# Patient Record
Sex: Male | Born: 1995 | Race: White | Hispanic: No | Marital: Single | Smoking: Never smoker
Health system: Southern US, Community
[De-identification: ages and names within clinical notes are randomized; demographics above are authoritative.]

## PROBLEM LIST (undated history)

## (undated) DIAGNOSIS — J45909 Unspecified asthma, uncomplicated: Secondary | ICD-10-CM

---
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
## 2018-01-15 ENCOUNTER — Emergency Department
Admission: EM | Admit: 2018-01-15 | Discharge: 2018-01-15 | Disposition: A | Discharge status: Home / Self Care | Payer: Self-pay | Attending: Emergency Medicine | Admitting: Emergency Medicine

## 2018-01-15 ENCOUNTER — Other Ambulatory Visit: Payer: Self-pay

## 2018-01-15 ENCOUNTER — Encounter: Payer: Self-pay | Admitting: Emergency Medicine

## 2018-01-15 DIAGNOSIS — R45851 Suicidal ideations: Secondary | ICD-10-CM | POA: Insufficient documentation

## 2018-01-15 DIAGNOSIS — F1510 Other stimulant abuse, uncomplicated: Secondary | ICD-10-CM

## 2018-01-15 DIAGNOSIS — F1410 Cocaine abuse, uncomplicated: Secondary | ICD-10-CM

## 2018-01-15 DIAGNOSIS — F1110 Opioid abuse, uncomplicated: Secondary | ICD-10-CM

## 2018-01-15 DIAGNOSIS — F1310 Sedative, hypnotic or anxiolytic abuse, uncomplicated: Secondary | ICD-10-CM

## 2018-01-15 DIAGNOSIS — F1994 Other psychoactive substance use, unspecified with psychoactive substance-induced mood disorder: Secondary | ICD-10-CM | POA: Insufficient documentation

## 2018-01-15 DIAGNOSIS — F1210 Cannabis abuse, uncomplicated: Secondary | ICD-10-CM

## 2018-01-15 DIAGNOSIS — F1910 Other psychoactive substance abuse, uncomplicated: Secondary | ICD-10-CM

## 2018-01-15 LAB — ETHANOL

## 2018-01-15 LAB — CBC
HEMATOCRIT: 42.2 % (ref 39.0–52.0)
Hemoglobin: 14 g/dL (ref 13.0–17.0)
MCH: 29.5 pg (ref 26.0–34.0)
MCHC: 33.2 g/dL (ref 30.0–36.0)
MCV: 89 fL (ref 80.0–100.0)
NRBC: 0 % (ref 0.0–0.2)
PLATELETS: 221 10*3/uL (ref 150–400)
RBC: 4.74 MIL/uL (ref 4.22–5.81)
RDW: 12.5 % (ref 11.5–15.5)
WBC: 7.8 10*3/uL (ref 4.0–10.5)

## 2018-01-15 LAB — SALICYLATE LEVEL

## 2018-01-15 LAB — COMPREHENSIVE METABOLIC PANEL
ALT: 10 U/L (ref 0–44)
AST: 15 U/L (ref 15–41)
Albumin: 4.4 g/dL (ref 3.5–5.0)
Alkaline Phosphatase: 63 U/L (ref 38–126)
Anion gap: 7 (ref 5–15)
BILIRUBIN TOTAL: 0.5 mg/dL (ref 0.3–1.2)
BUN: 20 mg/dL (ref 6–20)
CO2: 29 mmol/L (ref 22–32)
Calcium: 9.1 mg/dL (ref 8.9–10.3)
Chloride: 104 mmol/L (ref 98–111)
Creatinine, Ser: 1.03 mg/dL (ref 0.61–1.24)
Glucose, Bld: 102 mg/dL — ABNORMAL HIGH (ref 70–99)
POTASSIUM: 3.9 mmol/L (ref 3.5–5.1)
Sodium: 140 mmol/L (ref 135–145)
Total Protein: 7 g/dL (ref 6.5–8.1)

## 2018-01-15 LAB — URINE DRUG SCREEN, QUALITATIVE (ARMC ONLY)
AMPHETAMINES, UR SCREEN: POSITIVE — AB
BARBITURATES, UR SCREEN: NOT DETECTED
BENZODIAZEPINE, UR SCRN: POSITIVE — AB
Cannabinoid 50 Ng, Ur ~~LOC~~: POSITIVE — AB
Cocaine Metabolite,Ur ~~LOC~~: POSITIVE — AB
MDMA (Ecstasy)Ur Screen: NOT DETECTED
Methadone Scn, Ur: NOT DETECTED
OPIATE, UR SCREEN: POSITIVE — AB
PHENCYCLIDINE (PCP) UR S: NOT DETECTED
Tricyclic, Ur Screen: NOT DETECTED

## 2018-01-15 LAB — ACETAMINOPHEN LEVEL: Acetaminophen (Tylenol), Serum: <10 ug/mL — ABNORMAL LOW (ref 10–30)

## 2018-01-15 MED ORDER — SODIUM CHLORIDE 0.9 % IV BOLUS (SEPSIS): 1000.0000 mL | Freq: Once | INTRAVENOUS | Status: DC

## 2018-01-15 MED ORDER — SODIUM CHLORIDE 0.9 % IV BOLUS
1000.0000 mL | Freq: Once | INTRAVENOUS | Status: AC
  Administered 2018-01-15: 1000 mL via INTRAVENOUS

## 2018-01-15 NOTE — ED Notes (Addendum)
With male officer in attendance, pt removes jeans, plaid long sleeve shirt, grey long sleeve shirt, black wallet, cell phone, black hat, brown boots, red socks, red underwear, brown belt--all placed in labeled pt belonging bag to be secured on nursing unit & pt dressed in burgandy paper scrubs

## 2018-01-15 NOTE — ED Notes (Signed)
ED  Is the patient under IVC or is there intent for IVC: Yes.   Is the patient medically cleared: Yes.   Is there vacancy in the ED BHU: Yes.   Is the population mix appropriate for patient: Yes.   Is the patient awaiting placement in inpatient or outpatient setting:  Has the patient had a psychiatric consult:  Consult pending  Survey of unit performed for contraband, proper placement and condition of furniture, tampering with fixtures in bathroom, shower, and each patient room: Yes.  ; Findings:  APPEARANCE/BEHAVIOR Calm and cooperative NEURO ASSESSMENT Orientation: oriented x3  Denies pain Hallucinations: No.None noted (Hallucinations) denies  Speech: Normal Gait: normal RESPIRATORY ASSESSMENT Even  Unlabored respirations  CARDIOVASCULAR ASSESSMENT Pulses equal   regular rate  Skin warm and dry   GASTROINTESTINAL ASSESSMENT no GI complaint EXTREMITIES Full ROM  PLAN OF CARE Provide calm/safe environment. Vital signs assessed twice daily. ED BHU Assessment once each 12-hour shift. Collaborate with TTS daily or as condition indicates. Assure the ED provider has rounded once each shift. Provide and encourage hygiene. Provide redirection as needed. Assess for escalating behavior; address immediately and inform ED provider.  Assess family dynamic and appropriateness for visitation as needed: Yes.  ; If necessary, describe findings:  Educate the patient/family about BHU procedures/visitation: Yes.  ; If necessary, describe findings:   

## 2018-01-15 NOTE — ED Notes (Signed)
Patient observed lying in bed with eyes closed  Even, unlabored respirations observed   NAD pt appears to be sleeping  I will continue to monitor along with every 15 minute visual observations and ongoing security monitoring    

## 2018-01-15 NOTE — ED Notes (Signed)
Patient alert and oriented x 4. Patient denies SI/HI/AVH. Patient very guarded when this writer asking questions why he is here. Patient denies drug use except for 1 gram of xanax that was not prescribed to patient. Patient states he only smokes weed every once in a while. Patient states he does not know why he is here. This Clinical research associate explained that he is IVC and that he will need to see psych. Patient agreeable with plan.

## 2018-01-15 NOTE — ED Notes (Signed)
BEHAVIORAL HEALTH ROUNDING Patient sleeping: No. Patient alert and oriented: yes Behavior appropriate: Yes.  ; If no, describe:  Nutrition and fluids offered: yes Toileting and hygiene offered: Yes  Sitter present: q15 minute observations and security  monitoring Law enforcement present: Yes  ODS  
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
## 2018-01-15 NOTE — ED Notes (Signed)

## 2018-01-15 NOTE — ED Notes (Signed)
BEHAVIORAL HEALTH ROUNDING Patient sleeping: Yes.   Patient alert and oriented: eyes closed  Appears to be asleep Behavior appropriate: Yes.  ; If no, describe:  Nutrition and fluids offered: Yes  Toileting and hygiene offered: sleeping Sitter present: q 15 minute observations and security monitoring Law enforcement present: yes  ODS 

## 2018-01-15 NOTE — Discharge Instructions (Addendum)
Turn to the emergency department if you develop thoughts of hurting herself or anyone else, hallucinations, or any other symptoms concerning to you. °

## 2018-01-15 NOTE — ED Triage Notes (Addendum)
Patient ambulatory to triage with steady gait, without difficulty or distress noted, in custody of Product manager for [employer]; officer reports pt took xanax, unknown amount; pt st he took only "1/2 bar of a xanax"; pt denies SI or HI

## 2018-01-15 NOTE — ED Provider Notes (Signed)
[facility] Emergency Department Provider Note   First MD Initiated Contact with Patient 01/15/18 [phone]     (approximate)  I have reviewed the triage vital signs and the nursing notes.  Level 5 caveat: History and physical exam limited secondary to altered mental status HISTORY  Chief Complaint Mental Health Problem    HPI Jesus Morris is a 22 y.o. male presents to the emergency department via [facility] involuntarily committed secondary to stated suicidal ideation.  Patient admitted to the deputies that he took a "half of a Xanax".  Involuntary commitment papers stated that the patient's mother found him in his room minimally responsive.   Past medical history Unknown There are no active problems to display for this patient.   History reviewed. No pertinent surgical history.  Prior to Admission medications   Not on File    Allergies Peanut-containing drug products  No family history on file.  Social History Social History   Tobacco Use  . Smoking status: Never Smoker  . Smokeless tobacco: Never Used  Substance Use Topics  . Alcohol use: Not on file  . Drug use: Not on file    Review of Systems Constitutional: No fever/chills Eyes: No visual changes. ENT: No sore throat. Cardiovascular: Denies chest pain. Respiratory: Denies shortness of breath. Gastrointestinal: No abdominal pain.  No nausea, no vomiting.  No diarrhea.  No constipation. Genitourinary: Negative for dysuria. Musculoskeletal: Negative for neck pain.  Negative for back pain. Integumentary: Negative for rash. Neurological: Negative for headaches, focal weakness or numbness. Psychiatric:Positive for suicidal ideation and substance abuse   ____________________________________________   PHYSICAL EXAM:  VITAL SIGNS: ED Triage Vitals  Enc Vitals Group     BP 01/15/18 0418 119/73     Pulse Rate 01/15/18 0418 78     Resp 01/15/18 0418 18     Temp  01/15/18 0418 98 F (36.7 C)     Temp Source 01/15/18 0418 Oral     SpO2 01/15/18 0418 100 %     Weight 01/15/18 0419 70.3 kg (155 lb)     Height 01/15/18 0419 1.778 m (5\' 10" )     Head Circumference --      Peak Flow --      Pain Score 01/15/18 0418 0     Pain Loc --      Pain Edu? --      Excl. in GC? --     Constitutional: Alert and oriented.  Appears intoxicated  eyes: Conjunctivae are normal.  Dilated pupils bilaterally.  Reactive to light Head: Atraumatic. Mouth/Throat: Mucous membranes are moist.  Oropharynx non-erythematous. Neck: No stridor.   Cardiovascular: Normal rate, regular rhythm. Good peripheral circulation. Grossly normal heart sounds. Respiratory: Normal respiratory effort.  No retractions. Lungs CTAB. Gastrointestinal: Soft and nontender. No distention.  Musculoskeletal: No lower extremity tenderness nor edema. No gross deformities of extremities. Neurologic: Slurred speech and language. No gross focal neurologic deficits are appreciated.  Skin:  Skin is warm, dry and intact. No rash noted. Psychiatric: Patient appears intoxicated  ____________________________________________   LABS (all labs ordered are listed, but only abnormal results are displayed)  Labs Reviewed  COMPREHENSIVE METABOLIC PANEL - Abnormal; Notable for the following components:      Result Value   Glucose, Bld 102 (*)    All other components within normal limits  ACETAMINOPHEN LEVEL - Abnormal; Notable for the following components:   Acetaminophen (Tylenol), Serum <10 (*)    All other components  within normal limits  URINE DRUG SCREEN, QUALITATIVE ([facility] ONLY) - Abnormal; Notable for the following components:   Amphetamines, Ur Screen POSITIVE (*)    Cocaine Metabolite,Ur [place] POSITIVE (*)    Opiate, Ur Screen POSITIVE (*)    Cannabinoid 50 Ng, Ur [place] POSITIVE (*)    Benzodiazepine, Ur Scrn POSITIVE (*)    All other components within normal limits  ETHANOL  SALICYLATE LEVEL  CBC      Procedures   ____________________________________________   INITIAL IMPRESSION / ASSESSMENT AND PLAN / ED COURSE  As part of my medical decision making, I reviewed the following data within the electronic MEDICAL RECORD NUMBER 22 year old male presenting to the emergency department involuntarily committed secondary to above-stated suicidal ideation and substance abuse.  Patient urine drug screen positive for amphetamines benzodiazepines cocaine opiates and cannabinoids.  Await psychiatry consultation at this time ____________________________________________  FINAL CLINICAL IMPRESSION(S) / ED DIAGNOSES  Final diagnoses:  Polysubstance abuse (HCC)  Suicidal ideation     MEDICATIONS GIVEN DURING THIS VISIT:  Medications - No data to display   ED Discharge Orders    None       Note:  This document was prepared using Dragon voice recognition software and may include unintentional dictation errors.    Darci Current, MD 01/15/18 [phone]

## 2018-11-18 ENCOUNTER — Emergency Department
Admission: EM | Admit: 2018-11-18 | Discharge: 2018-11-18 | Disposition: A | Discharge status: Home / Self Care | Payer: Self-pay | Attending: Emergency Medicine | Admitting: Emergency Medicine

## 2018-11-18 ENCOUNTER — Other Ambulatory Visit: Payer: Self-pay

## 2018-11-18 ENCOUNTER — Emergency Department: Payer: Self-pay

## 2018-11-18 ENCOUNTER — Encounter: Payer: Self-pay | Admitting: Emergency Medicine

## 2018-11-18 DIAGNOSIS — J4541 Moderate persistent asthma with (acute) exacerbation: Secondary | ICD-10-CM | POA: Insufficient documentation

## 2018-11-18 DIAGNOSIS — J45901 Unspecified asthma with (acute) exacerbation: Secondary | ICD-10-CM

## 2018-11-18 DIAGNOSIS — Z91010 Allergy to peanuts: Secondary | ICD-10-CM | POA: Insufficient documentation

## 2018-11-18 HISTORY — DX: Unspecified asthma, uncomplicated: J45.909

## 2018-11-18 MED ORDER — PREDNISONE 10 MG PO TABS: 50.0000 mg | ORAL_TABLET | Freq: Every day | ORAL | 0 refills | Status: AC

## 2018-11-18 MED ORDER — PREDNISONE 20 MG PO TABS
60.0000 mg | ORAL_TABLET | Freq: Once | ORAL | Status: AC
  Administered 2018-11-18: 60 mg via ORAL
  Filled 2018-11-18: qty 3

## 2018-11-18 MED ORDER — IPRATROPIUM BROMIDE 0.02 % IN SOLN: 0.5000 mg | Freq: Four times a day (QID) | RESPIRATORY_TRACT | 0 refills | Status: AC | PRN

## 2018-11-18 MED ORDER — IPRATROPIUM-ALBUTEROL 20-100 MCG/ACT IN AERS: 1.0000 | INHALATION_SPRAY | Freq: Four times a day (QID) | RESPIRATORY_TRACT | 0 refills | Status: AC | PRN

## 2018-11-18 MED ORDER — IPRATROPIUM-ALBUTEROL 0.5-2.5 (3) MG/3ML IN SOLN
3.0000 mL | Freq: Once | RESPIRATORY_TRACT | Status: AC
Start: 2018-11-18 — End: 2018-11-18
  Administered 2018-11-18: 3 mL via RESPIRATORY_TRACT
  Filled 2018-11-18: qty 3

## 2018-11-18 MED ORDER — ALBUTEROL SULFATE HFA 108 (90 BASE) MCG/ACT IN AERS: 2.0000 | INHALATION_SPRAY | RESPIRATORY_TRACT | 0 refills | Status: DC | PRN

## 2018-11-18 MED ORDER — ALBUTEROL SULFATE HFA 108 (90 BASE) MCG/ACT IN AERS: 2.0000 | INHALATION_SPRAY | RESPIRATORY_TRACT | 0 refills | Status: AC | PRN

## 2018-11-18 NOTE — ED Provider Notes (Addendum)
[facility] Emergency Department Provider Note  ____________________________________________  Time seen: Approximately 12:23 PM  I have reviewed the triage vital signs and the nursing notes.   HISTORY  Chief Complaint Asthma   HPI Jesus Morris is a 23 y.o. male presents to the emergency department for treatment and evaluation of asthma.  Wheezing has been worse over the past 24 hours.  He has used his inhaler at home with some relief but continues to wheeze today and feels short of breath.   Mom states that he has had posttussive emesis since last night.   Past Medical History:  Diagnosis Date  . Asthma     Patient Active Problem List   Diagnosis Date Noted  . 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 Opiate abuse, continuous ([place]) 01/15/2018  . Cocaine abuse ([place]) 01/15/2018  . 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 Cannabis abuse 01/15/2018  . Substance induced mood disorder ([place]) 01/15/2018    History reviewed. No pertinent surgical history.  Prior to Admission medications   Medication Sig Start Date End Date Taking? Authorizing Provider  albuterol (VENTOLIN HFA) 108 (90 Base) MCG/ACT inhaler Inhale 2 puffs into the lungs every 4 (four) hours as needed for wheezing or shortness of breath. 11/18/18   Wyona Neils B, FNP  Ipratropium-Albuterol (COMBIVENT) 20-100 MCG/ACT AERS respimat Inhale 1 puff into the lungs every 6 (six) hours as needed for wheezing. 11/18/18   Aeon Kessner B, FNP  predniSONE (DELTASONE) 10 MG tablet Take 5 tablets (50 mg total) by mouth daily. 11/18/18   Elnor Renovato, Johnette Abraham B, FNP    Allergies Other and Peanut-containing drug products  No family history on file.  Social History Social History   Tobacco Use  . Smoking status: Never Smoker  . Smokeless tobacco: Never Used  Substance Use Topics  . Alcohol use: Not on file  . Drug use: Not on file    Review of Systems Constitutional: Negative for fever/chills.  Normal  appetite. ENT: Negative for sore throat. Cardiovascular: Denies chest pain. Respiratory: Positive for shortness of breath.  Positive for cough.  Positive for wheezing.  Gastrointestinal: Negative for nausea, positive for posttussive vomiting.  Negative for diarrhea.  Musculoskeletal: Negative for body aches Skin: Negative for rash. Neurological: Negative for headaches ____________________________________________   PHYSICAL EXAM:  VITAL SIGNS: ED Triage Vitals  Enc Vitals Group     BP 11/18/18 1142 119/82     Pulse Rate 11/18/18 1142 (!) 108     Resp --      Temp 11/18/18 1142 98.4 F (36.9 C)     Temp Source 11/18/18 1142 Oral     SpO2 11/18/18 1142 94 %     Weight 11/18/18 1143 165 lb (74.8 kg)     Height 11/18/18 1143 5\' 11"  (1.803 m)     Head Circumference --      Peak Flow --      Pain Score 11/18/18 1145 0     Pain Loc --      Pain Edu? --      Excl. in [place]? --     Constitutional: Alert and oriented.  Overall well appearing and in no acute distress. Eyes: Conjunctivae are normal. Ears: TM normal Nose: No sinus congestion noted; no rhinnorhea. Mouth/Throat: Mucous membranes are moist.   Neck: No stridor.  Cardiovascular: Normal rate, regular rhythm. Good peripheral circulation. Respiratory: Respirations are even and unlabored.  No retractions. Rhonchi and wheezing throughout. Gastrointestinal: Soft and nontender.  Musculoskeletal: FROM  x 4 extremities.  Neurologic:  Normal speech and language. Skin:  Skin is warm, dry and intact. No rash noted. Psychiatric: Mood and affect are normal. Speech and behavior are normal.  ____________________________________________   LABS (all labs ordered are listed, but only abnormal results are displayed)  Labs Reviewed - No data to display ____________________________________________  EKG  Not indicated. ____________________________________________  RADIOLOGY  Chest x-ray is  negative. ____________________________________________   PROCEDURES  Procedure(s) performed: None  Critical Care performed: No ____________________________________________   INITIAL IMPRESSION / ASSESSMENT AND PLAN / ED COURSE  23 y.o. male presenting to the emergency department for treatment and evaluation of asthma exacerbation.  Patient has no other signs of COVID-19.  While here, he was given a dose of prednisone and a DuoNeb.  Wheezing and rhonchi resolved.  Chest x-ray was normal.  He will be given prescriptions for prednisone, Combivent, and a refill of his albuterol inhaler.  He was encouraged to follow-up with primary care or return to the emergency department for symptoms of concern.   ----------------------------------------- 2:59 PM on 11/18/2018 -----------------------------------------  Mother called back. Inhaler is over $400.00 and they can not afford to get it. Solution was sent to [facility] per mother's request and she can also get a nebulizer machine there as well.   Medications  predniSONE (DELTASONE) tablet 60 mg (60 mg Oral Given 11/18/18 1233)  ipratropium-albuterol (DUONEB) 0.5-2.5 (3) MG/3ML nebulizer solution 3 mL (3 mLs Nebulization Given 11/18/18 1233)    ED Discharge Orders         Ordered    Ipratropium-Albuterol (COMBIVENT) 20-100 MCG/ACT AERS respimat  Every 6 hours PRN     11/18/18 1327    albuterol (VENTOLIN HFA) 108 (90 Base) MCG/ACT inhaler  Every 4 hours PRN,   Status:  Discontinued     11/18/18 1327    predniSONE (DELTASONE) 10 MG tablet  Daily     11/18/18 1340    albuterol (VENTOLIN HFA) 108 (90 Base) MCG/ACT inhaler  Every 4 hours PRN     11/18/18 1342           Pertinent labs & imaging results that were available during my care of the patient were reviewed by me and considered in my medical decision making (see chart for details).    If controlled substance prescribed during this visit, 12 month history viewed on the [facility]  prior to issuing an initial prescription for Schedule II or III opiod. ____________________________________________   FINAL CLINICAL IMPRESSION(S) / ED DIAGNOSES  Final diagnoses:  Moderate asthma with exacerbation, unspecified whether persistent    Note:  This document was prepared using Dragon voice recognition software and may include unintentional dictation errors.    Chinita Pesterriplett, Yanni Quiroa B, FNP 11/18/18 1427    Chinita Pesterriplett, Jazsmin Couse B, FNP 11/18/18 1500    Jene EveryKinner, Robert, MD 11/18/18 [phone]

## 2018-11-18 NOTE — Discharge Instructions (Signed)
Please follow up with your primary care provider for symptoms that are not improving over the next few days.  Return to the ER for symptoms that change or worsen if unable to schedule an appointment. 

## 2018-11-18 NOTE — ED Notes (Signed)
Pt reports improvement after duonebs

## 2018-11-18 NOTE — ED Triage Notes (Signed)
C/O wheezing and asthma worsening x 1 day.  Has been using inhaler at home with some relief.  AAOx3.  Skin warm and dry. NAD

## 2018-11-18 NOTE — ED Triage Notes (Signed)
Asthma attack last night.  Still feels short of breath.  pateint in nad.

## 2020-03-31 IMAGING — CR DG CHEST 2V
1 series · 2 of 2 positions shown · non-contrast
Comparison: None.

CLINICAL DATA: Cough, wheezing

EXAM:
CHEST - 2 VIEW

[Series 1: dg chest 2 view · 0.14mm/px · 2 of 2 slices shown]
[im 1/2]
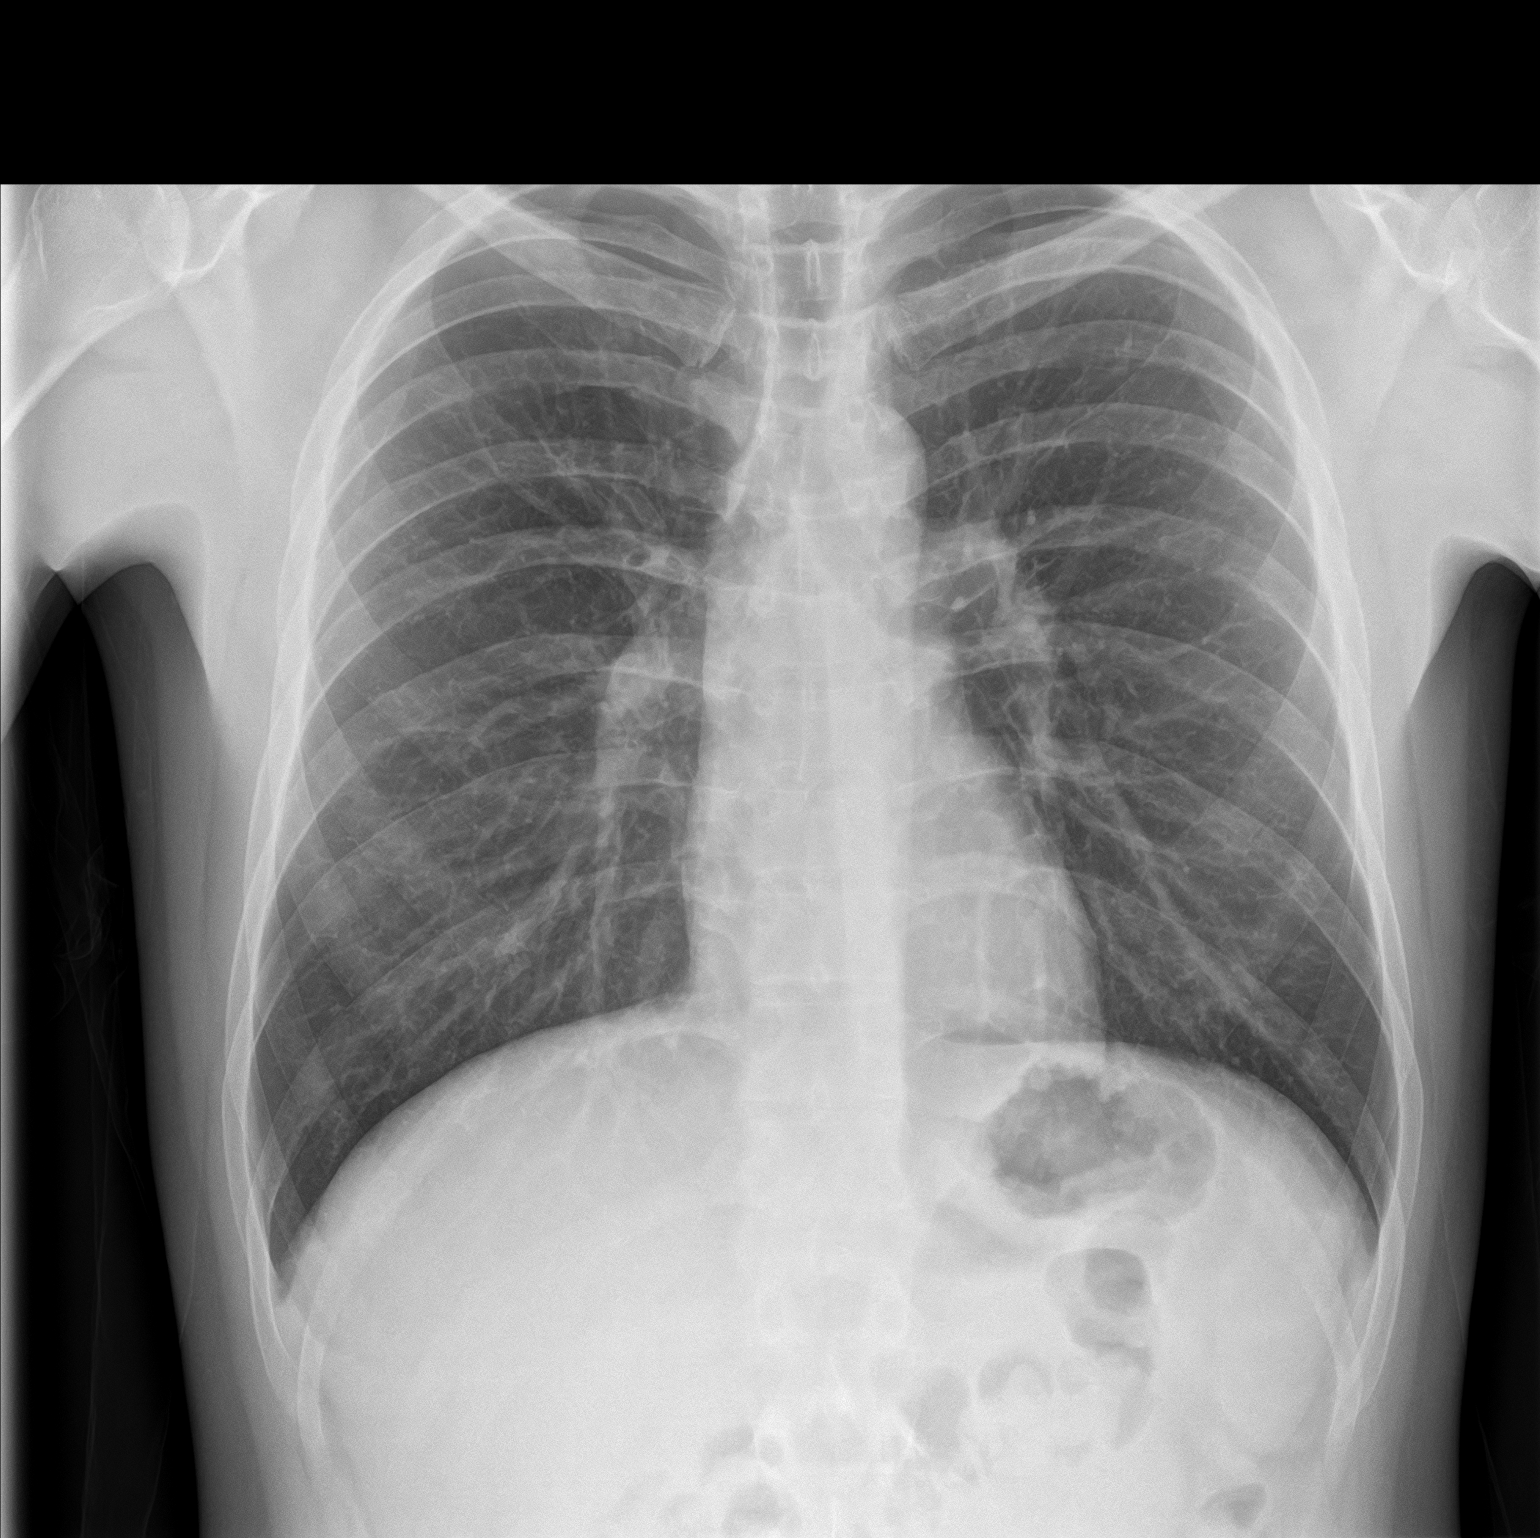
[im 2/2]
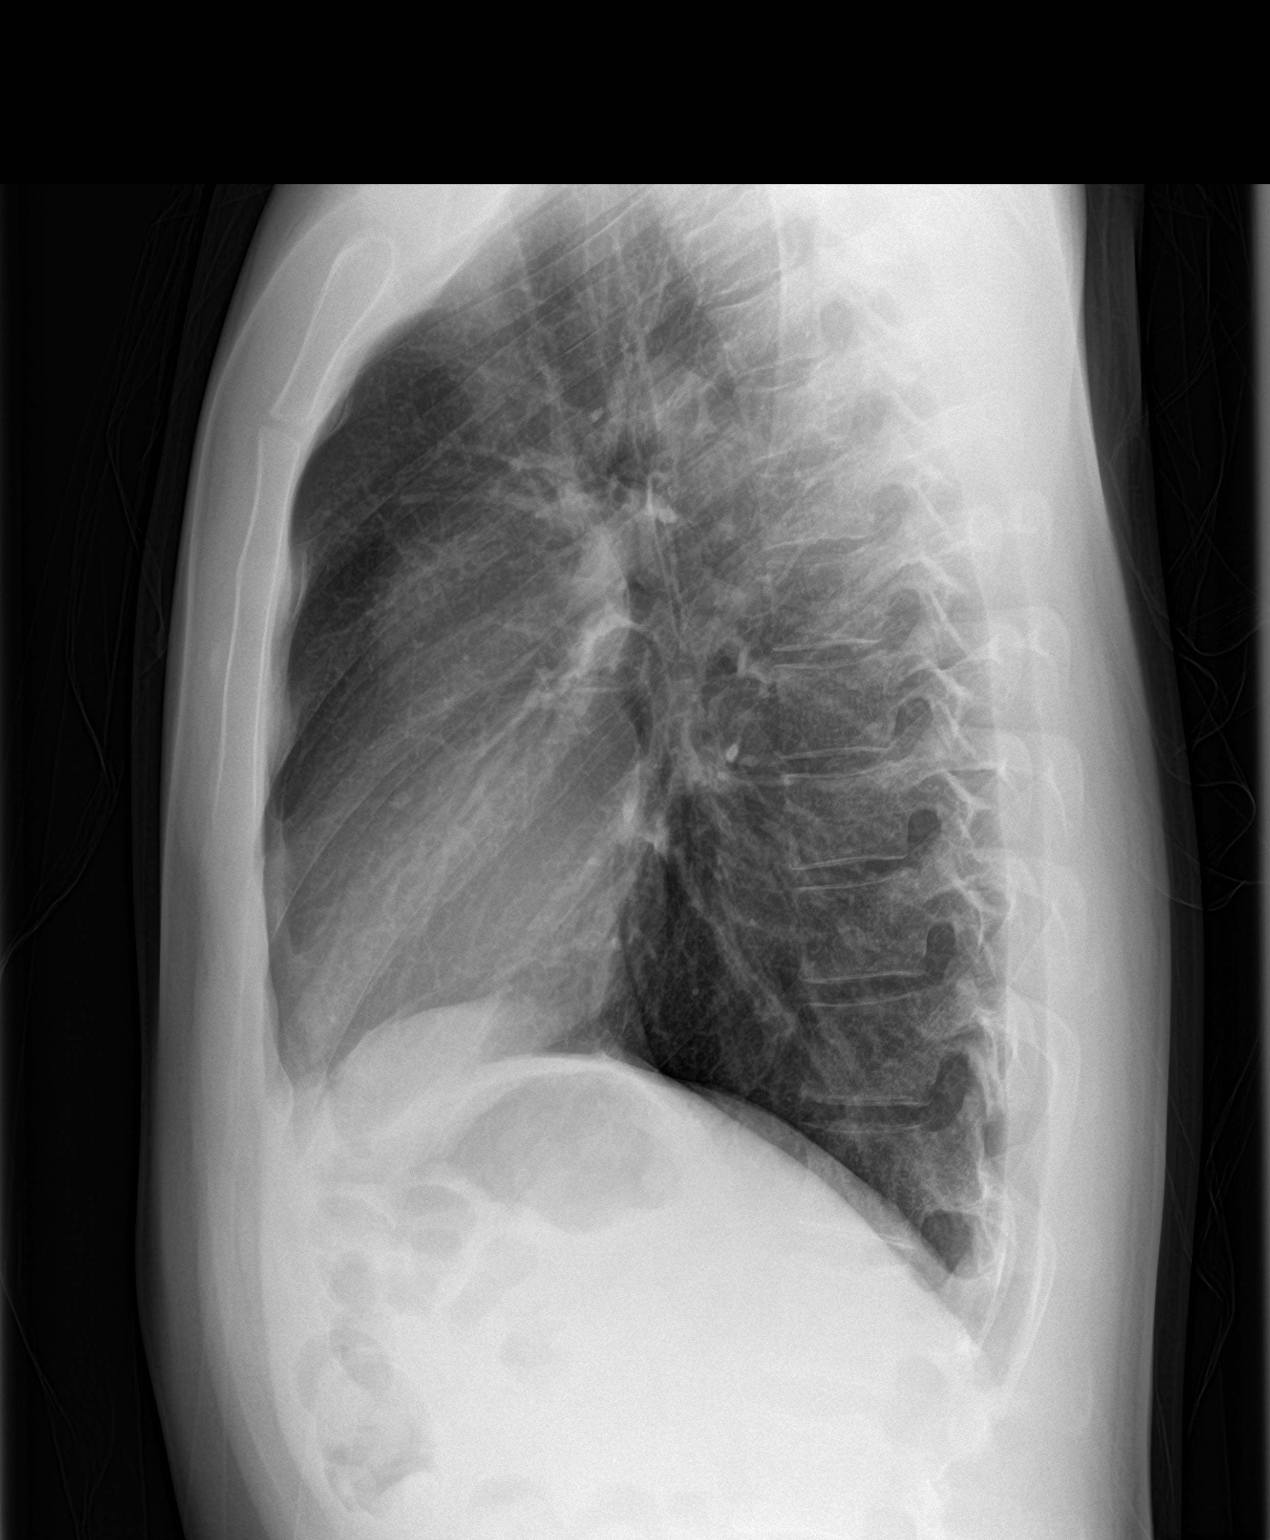

[2 of 2 positions shown; findings below may reference images not displayed]

FINDINGS: The heart size and mediastinal contours are within normal limits.
Both lungs are clear. The visualized skeletal structures are
unremarkable.
IMPRESSION: No acute abnormality of the lungs. Normal, symmetric bilateral
aeration of the lungs.
